# Patient Record
Sex: Male | Born: 1995 | Race: Black or African American | Hispanic: No | Marital: Single | Smoking: Current every day smoker
Health system: Southern US, Community
[De-identification: ages and names within clinical notes are randomized; demographics above are authoritative.]

---

## 2015-03-10 ENCOUNTER — Encounter (HOSPITAL_COMMUNITY): Payer: Self-pay | Admitting: Neurology

## 2015-03-10 ENCOUNTER — Emergency Department (HOSPITAL_COMMUNITY): Payer: BC Managed Care – PPO

## 2015-03-10 ENCOUNTER — Emergency Department (HOSPITAL_COMMUNITY)
Admission: EM | Admit: 2015-03-10 | Discharge: 2015-03-10 | Disposition: A | Discharge status: Home / Self Care | Payer: BC Managed Care – PPO | Attending: Emergency Medicine | Admitting: Emergency Medicine

## 2015-03-10 DIAGNOSIS — Y9389 Activity, other specified: Secondary | ICD-10-CM | POA: Insufficient documentation

## 2015-03-10 DIAGNOSIS — S39012A Strain of muscle, fascia and tendon of lower back, initial encounter: Secondary | ICD-10-CM | POA: Insufficient documentation

## 2015-03-10 DIAGNOSIS — S199XXA Unspecified injury of neck, initial encounter: Secondary | ICD-10-CM | POA: Diagnosis present

## 2015-03-10 DIAGNOSIS — Z720 Tobacco use: Secondary | ICD-10-CM | POA: Insufficient documentation

## 2015-03-10 DIAGNOSIS — S29001A Unspecified injury of muscle and tendon of front wall of thorax, initial encounter: Secondary | ICD-10-CM | POA: Insufficient documentation

## 2015-03-10 DIAGNOSIS — R0789 Other chest pain: Secondary | ICD-10-CM

## 2015-03-10 DIAGNOSIS — Y998 Other external cause status: Secondary | ICD-10-CM | POA: Insufficient documentation

## 2015-03-10 DIAGNOSIS — S161XXA Strain of muscle, fascia and tendon at neck level, initial encounter: Secondary | ICD-10-CM | POA: Diagnosis not present

## 2015-03-10 DIAGNOSIS — Y92410 Unspecified street and highway as the place of occurrence of the external cause: Secondary | ICD-10-CM | POA: Insufficient documentation

## 2015-03-10 MED ORDER — IBUPROFEN 400 MG PO TABS
600.0000 mg | ORAL_TABLET | Freq: Once | ORAL | Status: AC
  Administered 2015-03-10: 600 mg via ORAL
  Filled 2015-03-10: qty 2

## 2015-03-10 MED ORDER — METHOCARBAMOL 500 MG PO TABS: 500.0000 mg | ORAL_TABLET | Freq: Two times a day (BID) | ORAL | Status: AC

## 2015-03-10 MED ORDER — NAPROXEN 500 MG PO TABS: 500.0000 mg | ORAL_TABLET | Freq: Two times a day (BID) | ORAL | Status: DC
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
## 2015-03-10 NOTE — Discharge Instructions (Signed)
Naprosyn for pain. Robaxin for muscle spasms. Try heating pads, stretches, follow up with primary care doctor.    Cervical Sprain A cervical sprain is an injury in the neck in which the strong, fibrous tissues (ligaments) that connect your neck bones stretch or tear. Cervical sprains can range from mild to severe. Severe cervical sprains can cause the neck vertebrae to be unstable. This can lead to damage of the spinal cord and can result in serious nervous system problems. The amount of time it takes for a cervical sprain to get better depends on the cause and extent of the injury. Most cervical sprains heal in 1 to 3 weeks. CAUSES  Severe cervical sprains may be caused by:   Contact sport injuries (such as from football, rugby, wrestling, hockey, auto racing, gymnastics, diving, martial arts, or boxing).   Motor vehicle collisions.   Whiplash injuries. This is an injury from a sudden forward and backward whipping movement of the head and neck.  Falls.  Mild cervical sprains may be caused by:   Being in an awkward position, such as while cradling a telephone between your ear and shoulder.   Sitting in a chair that does not offer proper support.   Working at a poorly Marketing executive station.   Looking up or down for long periods of time.  SYMPTOMS   Pain, soreness, stiffness, or a burning sensation in the front, back, or sides of the neck. This discomfort may develop immediately after the injury or slowly, 24 hours or more after the injury.   Pain or tenderness directly in the middle of the back of the neck.   Shoulder or upper back pain.   Limited ability to move the neck.   Headache.   Dizziness.   Weakness, numbness, or tingling in the hands or arms.   Muscle spasms.   Difficulty swallowing or chewing.   Tenderness and swelling of the neck.  DIAGNOSIS  Most of the time your health care provider can diagnose a cervical sprain by taking your history  and doing a physical exam. Your health care provider will ask about previous neck injuries and any known neck problems, such as arthritis in the neck. X-rays may be taken to find out if there are any other problems, such as with the bones of the neck. Other tests, such as a CT scan or MRI, may also be needed.  TREATMENT  Treatment depends on the severity of the cervical sprain. Mild sprains can be treated with rest, keeping the neck in place (immobilization), and pain medicines. Severe cervical sprains are immediately immobilized. Further treatment is done to help with pain, muscle spasms, and other symptoms and may include:  Medicines, such as pain relievers, numbing medicines, or muscle relaxants.   Physical therapy. This may involve stretching exercises, strengthening exercises, and posture training. Exercises and improved posture can help stabilize the neck, strengthen muscles, and help stop symptoms from returning.  HOME CARE INSTRUCTIONS   Put ice on the injured area.   Put ice in a plastic bag.   Place a towel between your skin and the bag.   Leave the ice on for 15-20 minutes, 3-4 times a day.   If your injury was severe, you may have been given a cervical collar to wear. A cervical collar is a two-piece collar designed to keep your neck from moving while it heals.  Do not remove the collar unless instructed by your health care provider.  If you have long hair,  keep it outside of the collar.  Ask your health care provider before making any adjustments to your collar. Minor adjustments may be required over time to improve comfort and reduce pressure on your chin or on the back of your head.  Ifyou are allowed to remove the collar for cleaning or bathing, follow your health care provider's instructions on how to do so safely.  Keep your collar clean by wiping it with mild soap and water and drying it completely. If the collar you have been given includes removable pads, remove  them every 1-2 days and hand wash them with soap and water. Allow them to air dry. They should be completely dry before you wear them in the collar.  If you are allowed to remove the collar for cleaning and bathing, wash and dry the skin of your neck. Check your skin for irritation or sores. If you see any, tell your health care provider.  Do not drive while wearing the collar.   Only take over-the-counter or prescription medicines for pain, discomfort, or fever as directed by your health care provider.   Keep all follow-up appointments as directed by your health care provider.   Keep all physical therapy appointments as directed by your health care provider.   Make any needed adjustments to your workstation to promote good posture.   Avoid positions and activities that make your symptoms worse.   Warm up and stretch before being active to help prevent problems.  SEEK MEDICAL CARE IF:   Your pain is not controlled with medicine.   You are unable to decrease your pain medicine over time as planned.   Your activity level is not improving as expected.  SEEK IMMEDIATE MEDICAL CARE IF:   You develop any bleeding.  You develop stomach upset.  You have signs of an allergic reaction to your medicine.   Your symptoms get worse.   You develop new, unexplained symptoms.   You have numbness, tingling, weakness, or paralysis in any part of your body.  MAKE SURE YOU:   Understand these instructions.  Will watch your condition.  Will get help right away if you are not doing well or get worse.   This information is not intended to replace advice given to you by your health care provider. Make sure you discuss any questions you have with your health care provider.   Document Released: 03/04/2007 Document Revised: 05/12/2013 Document Reviewed: 11/12/2012 Elsevier Interactive Patient Education 2016 [employer]  Tourist information centre managerMotor Vehicle Collision It is common to have multiple  bruises and sore muscles after a motor vehicle collision (MVC). These tend to feel worse for the first 24 hours. You may have the most stiffness and soreness over the first several hours. You may also feel worse when you wake up the first morning after your collision. After this point, you will usually begin to improve with each day. The speed of improvement often depends on the severity of the collision, the number of injuries, and the location and nature of these injuries. HOME CARE INSTRUCTIONS  Put ice on the injured area.  Put ice in a plastic bag.  Place a towel between your skin and the bag.  Leave the ice on for 15-20 minutes, 3-4 times a day, or as directed by your health care provider.  Drink enough fluids to keep your urine clear or pale yellow. Do not drink alcohol.  Take a warm shower or bath once or twice a day. This will increase blood flow  to sore muscles.  You may return to activities as directed by your caregiver. Be careful when lifting, as this may aggravate neck or back pain.  Only take over-the-counter or prescription medicines for pain, discomfort, or fever as directed by your caregiver. Do not use aspirin. This may increase bruising and bleeding. SEEK IMMEDIATE MEDICAL CARE IF:  You have numbness, tingling, or weakness in the arms or legs.  You develop severe headaches not relieved with medicine.  You have severe neck pain, especially tenderness in the middle of the back of your neck.  You have changes in bowel or bladder control.  There is increasing pain in any area of the body.  You have shortness of breath, light-headedness, dizziness, or fainting.  You have chest pain.  You feel sick to your stomach (nauseous), throw up (vomit), or sweat.  You have increasing abdominal discomfort.  There is blood in your urine, stool, or vomit.  You have pain in your shoulder (shoulder strap areas).  You feel your symptoms are getting worse. MAKE SURE  YOU:  Understand these instructions.  Will watch your condition.  Will get help right away if you are not doing well or get worse.   This information is not intended to replace advice given to you by your health care provider. Make sure you discuss any questions you have with your health care provider.   Document Released: 05/07/2005 Document Revised: 05/28/2014 Document Reviewed: 10/04/2010 Elsevier Interactive Patient Education [employer]

## 2015-03-10 NOTE — ED Notes (Signed)
Pt stable, ambulatory, states understanding of discharge instructions 

## 2015-05-18 ENCOUNTER — Encounter (HOSPITAL_COMMUNITY): Payer: Self-pay | Admitting: Emergency Medicine

## 2015-05-18 DIAGNOSIS — F17200 Nicotine dependence, unspecified, uncomplicated: Secondary | ICD-10-CM | POA: Insufficient documentation

## 2015-05-18 DIAGNOSIS — R1084 Generalized abdominal pain: Secondary | ICD-10-CM | POA: Diagnosis present

## 2015-05-18 DIAGNOSIS — K3580 Unspecified acute appendicitis: Principal | ICD-10-CM | POA: Insufficient documentation

## 2015-05-18 LAB — CBC
HEMATOCRIT: 47.9 % (ref 39.0–52.0)
Hemoglobin: 17 g/dL (ref 13.0–17.0)
MCH: 27.7 pg (ref 26.0–34.0)
MCHC: 35.5 g/dL (ref 30.0–36.0)
MCV: 78 fL (ref 78.0–100.0)
PLATELETS: 242 10*3/uL (ref 150–400)
RBC: 6.14 MIL/uL — ABNORMAL HIGH (ref 4.22–5.81)
RDW: 13.8 % (ref 11.5–15.5)
WBC: 19.8 10*3/uL — ABNORMAL HIGH (ref 4.0–10.5)

## 2015-05-18 LAB — URINE MICROSCOPIC-ADD ON

## 2015-05-18 LAB — URINALYSIS, ROUTINE W REFLEX MICROSCOPIC
Bilirubin Urine: NEGATIVE
GLUCOSE, UA: NEGATIVE mg/dL
HGB URINE DIPSTICK: NEGATIVE
KETONES UR: 15 mg/dL — AB
Nitrite: NEGATIVE
PH: 6 (ref 5.0–8.0)
Protein, ur: 30 mg/dL — AB
Specific Gravity, Urine: 1.036 — ABNORMAL HIGH (ref 1.005–1.030)

## 2015-05-18 LAB — COMPREHENSIVE METABOLIC PANEL
ALBUMIN: 4.9 g/dL (ref 3.5–5.0)
ALT: 39 U/L (ref 17–63)
AST: 28 U/L (ref 15–41)
Alkaline Phosphatase: 55 U/L (ref 38–126)
Anion gap: 16 — ABNORMAL HIGH (ref 5–15)
BUN: 8 mg/dL (ref 6–20)
CHLORIDE: 103 mmol/L (ref 101–111)
CO2: 24 mmol/L (ref 22–32)
CREATININE: 0.99 mg/dL (ref 0.61–1.24)
Calcium: 10.3 mg/dL (ref 8.9–10.3)
GFR calc Af Amer: >60 mL/min (ref >60)
GLUCOSE: 103 mg/dL — AB (ref 65–99)
Potassium: 4.2 mmol/L (ref 3.5–5.1)
SODIUM: 143 mmol/L (ref 135–145)
Total Bilirubin: 1.9 mg/dL — ABNORMAL HIGH (ref 0.3–1.2)
Total Protein: 8.3 g/dL — ABNORMAL HIGH (ref 6.5–8.1)

## 2015-05-18 LAB — LIPASE, BLOOD: LIPASE: 30 U/L (ref 11–51)

## 2015-05-18 MED ORDER — ONDANSETRON 4 MG PO TBDP
4.0000 mg | ORAL_TABLET | Freq: Once | ORAL | Status: AC | PRN
  Administered 2015-05-18: 4 mg via ORAL

## 2015-05-18 MED ORDER — ONDANSETRON 4 MG PO TBDP
ORAL_TABLET | ORAL | Status: AC
  Filled 2015-05-18: qty 1

## 2015-05-18 NOTE — ED Notes (Signed)
Pt from home for eval of emesis, and diarrhea that started at 0300 this morning. Pt states about 15-20 episodes of both and pt unable to keep any fluids or food down. Pt reports sharp pains in his abdomen that started with the vomiting.

## 2015-05-19 ENCOUNTER — Observation Stay (HOSPITAL_COMMUNITY)
Admission: EM | Admit: 2015-05-19 | Discharge: 2015-05-20 | Disposition: A | Discharge status: Home / Self Care | Payer: BC Managed Care – PPO | Attending: General Surgery | Admitting: General Surgery

## 2015-05-19 ENCOUNTER — Encounter (HOSPITAL_COMMUNITY)
Admission: EM | Disposition: A | Discharge status: Home / Self Care | Payer: Self-pay | Source: Home / Self Care | Attending: Emergency Medicine

## 2015-05-19 ENCOUNTER — Emergency Department (HOSPITAL_COMMUNITY): Payer: BC Managed Care – PPO

## 2015-05-19 ENCOUNTER — Encounter (HOSPITAL_COMMUNITY): Payer: Self-pay | Admitting: Radiology

## 2015-05-19 ENCOUNTER — Observation Stay (HOSPITAL_COMMUNITY): Payer: BC Managed Care – PPO | Admitting: Certified Registered Nurse Anesthetist

## 2015-05-19 DIAGNOSIS — K37 Unspecified appendicitis: Secondary | ICD-10-CM | POA: Diagnosis present

## 2015-05-19 DIAGNOSIS — K3580 Unspecified acute appendicitis: Secondary | ICD-10-CM

## 2015-05-19 HISTORY — PX: LAPAROSCOPIC APPENDECTOMY: SHX408

## 2015-05-19 LAB — SURGICAL PCR SCREEN
MRSA, PCR: NEGATIVE
Staphylococcus aureus: NEGATIVE

## 2015-05-19 SURGERY — APPENDECTOMY, LAPAROSCOPIC
Anesthesia: General | Site: Abdomen

## 2015-05-19 MED ORDER — ONDANSETRON 4 MG PO TBDP: 4.0000 mg | ORAL_TABLET | Freq: Four times a day (QID) | ORAL | Status: DC | PRN

## 2015-05-19 MED ORDER — PROPOFOL 10 MG/ML IV BOLUS
INTRAVENOUS | Status: AC
  Filled 2015-05-19: qty 20

## 2015-05-19 MED ORDER — PROPOFOL 10 MG/ML IV BOLUS
INTRAVENOUS | Status: DC | PRN
  Administered 2015-05-19: 200 mg via INTRAVENOUS

## 2015-05-19 MED ORDER — IBUPROFEN 600 MG PO TABS
600.0000 mg | ORAL_TABLET | Freq: Four times a day (QID) | ORAL | Status: DC | PRN
  Filled 2015-05-19: qty 1

## 2015-05-19 MED ORDER — LACTATED RINGERS IV SOLN
INTRAVENOUS | Status: DC
  Administered 2015-05-19: 10:00:00 via INTRAVENOUS

## 2015-05-19 MED ORDER — BUPIVACAINE-EPINEPHRINE 0.25% -1:200000 IJ SOLN
INTRAMUSCULAR | Status: DC | PRN
  Administered 2015-05-19: 12 mL

## 2015-05-19 MED ORDER — PANTOPRAZOLE SODIUM 40 MG IV SOLR
40.0000 mg | Freq: Every day | INTRAVENOUS | Status: DC
  Administered 2015-05-19: 40 mg via INTRAVENOUS
  Filled 2015-05-19: qty 40

## 2015-05-19 MED ORDER — ONDANSETRON HCL 4 MG/2ML IJ SOLN: 4.0000 mg | Freq: Four times a day (QID) | INTRAMUSCULAR | Status: DC | PRN

## 2015-05-19 MED ORDER — MIDAZOLAM HCL 2 MG/2ML IJ SOLN
INTRAMUSCULAR | Status: AC
  Filled 2015-05-19: qty 2

## 2015-05-19 MED ORDER — SUCCINYLCHOLINE CHLORIDE 20 MG/ML IJ SOLN
INTRAMUSCULAR | Status: DC | PRN
  Administered 2015-05-19: 100 mg via INTRAVENOUS

## 2015-05-19 MED ORDER — ONDANSETRON HCL 4 MG/2ML IJ SOLN
4.0000 mg | Freq: Once | INTRAMUSCULAR | Status: AC
  Administered 2015-05-19: 4 mg via INTRAVENOUS
  Filled 2015-05-19: qty 2

## 2015-05-19 MED ORDER — SODIUM CHLORIDE 0.9 % IV BOLUS (SEPSIS)
2000.0000 mL | Freq: Once | INTRAVENOUS | Status: AC
Start: 2015-05-19 — End: 2015-05-19
  Administered 2015-05-19: 2000 mL via INTRAVENOUS

## 2015-05-19 MED ORDER — FENTANYL CITRATE (PF) 100 MCG/2ML IJ SOLN
INTRAMUSCULAR | Status: DC | PRN
Start: 2015-05-19 — End: 2015-05-19
  Administered 2015-05-19: 100 ug via INTRAVENOUS

## 2015-05-19 MED ORDER — SUGAMMADEX SODIUM 200 MG/2ML IV SOLN
INTRAVENOUS | Status: AC
  Filled 2015-05-19: qty 2

## 2015-05-19 MED ORDER — BUPIVACAINE-EPINEPHRINE (PF) 0.25% -1:200000 IJ SOLN
INTRAMUSCULAR | Status: AC
  Filled 2015-05-19: qty 30

## 2015-05-19 MED ORDER — PIPERACILLIN-TAZOBACTAM 3.375 G IVPB
3.3750 g | Freq: Three times a day (TID) | INTRAVENOUS | Status: DC
  Administered 2015-05-19: 3.375 g via INTRAVENOUS
  Filled 2015-05-19 (×3): qty 50

## 2015-05-19 MED ORDER — 0.9 % SODIUM CHLORIDE (POUR BTL) OPTIME
TOPICAL | Status: DC | PRN
  Administered 2015-05-19: 1000 mL

## 2015-05-19 MED ORDER — SUGAMMADEX SODIUM 200 MG/2ML IV SOLN
INTRAVENOUS | Status: DC | PRN
  Administered 2015-05-19: 200 mg via INTRAVENOUS

## 2015-05-19 MED ORDER — HYDROMORPHONE HCL 1 MG/ML IJ SOLN
0.5000 mg | Freq: Once | INTRAMUSCULAR | Status: AC
  Administered 2015-05-19: 0.5 mg via INTRAVENOUS
  Filled 2015-05-19: qty 1

## 2015-05-19 MED ORDER — KCL IN DEXTROSE-NACL 20-5-0.9 MEQ/L-%-% IV SOLN
INTRAVENOUS | Status: DC
  Administered 2015-05-19: 08:00:00 via INTRAVENOUS
  Filled 2015-05-19 (×2): qty 1000

## 2015-05-19 MED ORDER — MORPHINE SULFATE (PF) 2 MG/ML IV SOLN
1.0000 mg | INTRAVENOUS | Status: DC | PRN
  Administered 2015-05-19: 2 mg via INTRAVENOUS
  Filled 2015-05-19: qty 1

## 2015-05-19 MED ORDER — MIDAZOLAM HCL 5 MG/5ML IJ SOLN
INTRAMUSCULAR | Status: DC | PRN
  Administered 2015-05-19: 1 mg via INTRAVENOUS

## 2015-05-19 MED ORDER — OXYCODONE-ACETAMINOPHEN 5-325 MG PO TABS
1.0000 | ORAL_TABLET | ORAL | Status: DC | PRN
  Administered 2015-05-19 – 2015-05-20 (×6): 2 via ORAL
  Filled 2015-05-19 (×6): qty 2

## 2015-05-19 MED ORDER — IOHEXOL 300 MG/ML  SOLN
100.0000 mL | Freq: Once | INTRAMUSCULAR | Status: AC | PRN
  Administered 2015-05-19: 100 mL via INTRAVENOUS

## 2015-05-19 MED ORDER — LIDOCAINE HCL (CARDIAC) 20 MG/ML IV SOLN
INTRAVENOUS | Status: DC | PRN
  Administered 2015-05-19: 80 mg via INTRAVENOUS

## 2015-05-19 MED ORDER — PIPERACILLIN-TAZOBACTAM 3.375 G IVPB
3.3750 g | Freq: Three times a day (TID) | INTRAVENOUS | Status: AC
  Administered 2015-05-19 – 2015-05-20 (×3): 3.375 g via INTRAVENOUS
  Filled 2015-05-19 (×3): qty 50

## 2015-05-19 MED ORDER — SODIUM CHLORIDE 0.9 % IR SOLN
Status: DC | PRN
  Administered 2015-05-19: 1000 mL

## 2015-05-19 MED ORDER — FENTANYL CITRATE (PF) 100 MCG/2ML IJ SOLN: 25.0000 ug | INTRAMUSCULAR | Status: DC | PRN

## 2015-05-19 MED ORDER — ONDANSETRON HCL 4 MG/2ML IJ SOLN: 4.0000 mg | Freq: Once | INTRAMUSCULAR | Status: DC | PRN

## 2015-05-19 MED ORDER — FENTANYL CITRATE (PF) 250 MCG/5ML IJ SOLN
INTRAMUSCULAR | Status: AC
  Filled 2015-05-19: qty 5

## 2015-05-19 MED ORDER — DEXAMETHASONE SODIUM PHOSPHATE 10 MG/ML IJ SOLN
INTRAMUSCULAR | Status: DC | PRN
  Administered 2015-05-19: 10 mg via INTRAVENOUS

## 2015-05-19 MED ORDER — ROCURONIUM BROMIDE 100 MG/10ML IV SOLN
INTRAVENOUS | Status: DC | PRN
  Administered 2015-05-19: 50 mg via INTRAVENOUS

## 2015-05-19 SURGICAL SUPPLY — 41 items
APPLIER CLIP ROT 10 11.4 M/L (STAPLE)
CANISTER SUCTION 2500CC (MISCELLANEOUS) ×3 IMPLANT
CHLORAPREP W/TINT 26ML (MISCELLANEOUS) ×3 IMPLANT
CLIP APPLIE ROT 10 11.4 M/L (STAPLE) IMPLANT
CLOSURE STERI-STRIP 1/2X4 (GAUZE/BANDAGES/DRESSINGS) ×1
CLOSURE WOUND 1/2 X4 (GAUZE/BANDAGES/DRESSINGS) ×1
CLSR STERI-STRIP ANTIMIC 1/2X4 (GAUZE/BANDAGES/DRESSINGS) ×2 IMPLANT
COVER SURGICAL LIGHT HANDLE (MISCELLANEOUS) ×3 IMPLANT
CUTTER FLEX LINEAR 45M (STAPLE) ×3 IMPLANT
DEVICE TROCAR PUNCTURE CLOSURE (ENDOMECHANICALS) ×3 IMPLANT
ELECT REM PT RETURN 9FT ADLT (ELECTROSURGICAL) ×3
ELECTRODE REM PT RTRN 9FT ADLT (ELECTROSURGICAL) ×1 IMPLANT
GLOVE BIO SURGEON STRL SZ7 (GLOVE) ×3 IMPLANT
GLOVE BIOGEL PI IND STRL 7.5 (GLOVE) ×1 IMPLANT
GLOVE BIOGEL PI INDICATOR 7.5 (GLOVE) ×2
GOWN STRL REUS W/ TWL LRG LVL3 (GOWN DISPOSABLE) ×3 IMPLANT
GOWN STRL REUS W/TWL LRG LVL3 (GOWN DISPOSABLE) ×6
KIT BASIN OR (CUSTOM PROCEDURE TRAY) ×3 IMPLANT
KIT ROOM TURNOVER OR (KITS) ×3 IMPLANT
LIQUID BAND (GAUZE/BANDAGES/DRESSINGS) ×3 IMPLANT
NS IRRIG 1000ML POUR BTL (IV SOLUTION) ×3 IMPLANT
PAD ARMBOARD 7.5X6 YLW CONV (MISCELLANEOUS) ×6 IMPLANT
POUCH RETRIEVAL ECOSAC 10 (ENDOMECHANICALS) ×1 IMPLANT
POUCH RETRIEVAL ECOSAC 10MM (ENDOMECHANICALS) ×2
RELOAD 45 VASCULAR/THIN (ENDOMECHANICALS) ×3 IMPLANT
RELOAD STAPLE TA45 3.5 REG BLU (ENDOMECHANICALS) ×3 IMPLANT
SCALPEL HARMONIC ACE (MISCELLANEOUS) ×3 IMPLANT
SCISSORS LAP 5X35 DISP (ENDOMECHANICALS) IMPLANT
SET IRRIG TUBING LAPAROSCOPIC (IRRIGATION / IRRIGATOR) ×3 IMPLANT
SLEEVE ENDOPATH XCEL 5M (ENDOMECHANICALS) ×3 IMPLANT
SPECIMEN JAR SMALL (MISCELLANEOUS) ×3 IMPLANT
STRIP CLOSURE SKIN 1/2X4 (GAUZE/BANDAGES/DRESSINGS) ×2 IMPLANT
SUT MNCRL AB 4-0 PS2 18 (SUTURE) ×3 IMPLANT
SUT VICRYL 0 UR6 27IN ABS (SUTURE) ×3 IMPLANT
TOWEL OR 17X24 6PK STRL BLUE (TOWEL DISPOSABLE) ×3 IMPLANT
TOWEL OR 17X26 10 PK STRL BLUE (TOWEL DISPOSABLE) ×3 IMPLANT
TRAY FOLEY CATH 16FR SILVER (SET/KITS/TRAYS/PACK) ×3 IMPLANT
TRAY LAPAROSCOPIC MC (CUSTOM PROCEDURE TRAY) ×3 IMPLANT
TROCAR XCEL BLUNT TIP 100MML (ENDOMECHANICALS) ×3 IMPLANT
TROCAR XCEL NON-BLD 5MMX100MML (ENDOMECHANICALS) ×3 IMPLANT
TUBING INSUFFLATION (TUBING) ×3 IMPLANT

## 2015-05-19 NOTE — ED Notes (Signed)
Attempted to call report

## 2015-05-19 NOTE — H&P (Signed)
Christopher Graham is an 19 y.o. male.   Chief Complaint: abdominal pain HPI:  The patient is a 19 year old black male who presents to the emergency department with abdominal pain that started about 24 hours ago. The pain is been constant. He complains of pain diffusely but its worse in the right lower quadrant. The pain has been associated with significant nausea and vomiting. He states that he did feel fevered at one point. He came to the emergency department where a CT scan shows inflammation of the appendix but no evidence of perforation. He does have a couple of appendicoliths.  History reviewed. No pertinent past medical history.  History reviewed. No pertinent past surgical history.  No family history on file. Social History:  reports that he has been smoking.  He does not have any smokeless tobacco history on file. He reports that he does not drink alcohol. His drug history is not on file.  Allergies:  Allergies  Allergen Reactions  . Tomato Itching     (Not in a hospital admission)  Results for orders placed or performed during the hospital encounter of 05/19/15 (from the past 48 hour(s))  Lipase, blood     Status: None   Collection Time: 05/18/15  8:36 PM  Result Value Ref Range   Lipase 30 11 - 51 U/L  Comprehensive metabolic panel     Status: Abnormal   Collection Time: 05/18/15  8:36 PM  Result Value Ref Range   Sodium 143 135 - 145 mmol/L   Potassium 4.2 3.5 - 5.1 mmol/L   Chloride 103 101 - 111 mmol/L   CO2 24 22 - 32 mmol/L   Glucose, Bld 103 (H) 65 - 99 mg/dL   BUN 8 6 - 20 mg/dL   Creatinine, Ser 0.99 0.61 - 1.24 mg/dL   Calcium 10.3 8.9 - 10.3 mg/dL   Total Protein 8.3 (H) 6.5 - 8.1 g/dL   Albumin 4.9 3.5 - 5.0 g/dL   AST 28 15 - 41 U/L   ALT 39 17 - 63 U/L   Alkaline Phosphatase 55 38 - 126 U/L   Total Bilirubin 1.9 (H) 0.3 - 1.2 mg/dL   GFR calc non Af Amer >60 >60 mL/min   GFR calc Af Amer >60 >60 mL/min    Comment: (NOTE) The eGFR has been calculated  using the CKD EPI equation. This calculation has not been validated in all clinical situations. eGFR's persistently <60 mL/min signify possible Chronic Kidney Disease.    Anion gap 16 (H) 5 - 15  CBC     Status: Abnormal   Collection Time: 05/18/15  8:36 PM  Result Value Ref Range   WBC 19.8 (H) 4.0 - 10.5 K/uL   RBC 6.14 (H) 4.22 - 5.81 MIL/uL   Hemoglobin 17.0 13.0 - 17.0 g/dL   HCT 47.9 39.0 - 52.0 %   MCV 78.0 78.0 - 100.0 fL   MCH 27.7 26.0 - 34.0 pg   MCHC 35.5 30.0 - 36.0 g/dL   RDW 13.8 11.5 - 15.5 %   Platelets 242 150 - 400 K/uL  Urinalysis, Routine w reflex microscopic (not at [facility])     Status: Abnormal   Collection Time: 05/18/15  9:00 PM  Result Value Ref Range   Color, Urine AMBER (A) YELLOW    Comment: BIOCHEMICALS MAY BE AFFECTED BY COLOR   APPearance CLEAR CLEAR   Specific Gravity, Urine 1.036 (H) 1.005 - 1.030   pH 6.0 5.0 - 8.0   Glucose, UA  NEGATIVE NEGATIVE mg/dL   Hgb urine dipstick NEGATIVE NEGATIVE   Bilirubin Urine NEGATIVE NEGATIVE   Ketones, ur 15 (A) NEGATIVE mg/dL   Protein, ur 30 (A) NEGATIVE mg/dL   Nitrite NEGATIVE NEGATIVE   Leukocytes, UA TRACE (A) NEGATIVE  Urine microscopic-add on     Status: Abnormal   Collection Time: 05/18/15  9:00 PM  Result Value Ref Range   Squamous Epithelial / LPF 0-5 (A) NONE SEEN   WBC, UA 6-30 0 - 5 WBC/hpf   RBC / HPF 0-5 0 - 5 RBC/hpf   Bacteria, UA RARE (A) NONE SEEN   Urine-Other MUCOUS PRESENT    Ct Abdomen Pelvis W Contrast  05/19/2015  CLINICAL DATA:  Acute onset of vomiting and diarrhea. Sharp abdominal pain. Initial encounter. EXAM: CT ABDOMEN AND PELVIS WITH CONTRAST TECHNIQUE: Multidetector CT imaging of the abdomen and pelvis was performed using the standard protocol following bolus administration of intravenous contrast. CONTRAST:  152m OMNIPAQUE IOHEXOL 300 MG/ML  SOLN COMPARISON:  None. FINDINGS: The visualized lung bases are clear. The liver and spleen are unremarkable in appearance. The  gallbladder is within normal limits. The pancreas and adrenal glands are unremarkable. The kidneys are unremarkable in appearance. There is no evidence of hydronephrosis. No renal or ureteral stones are seen. No perinephric stranding is appreciated. No free fluid is identified. The small bowel is unremarkable in appearance. The stomach is within normal limits. No acute vascular abnormalities are seen. The appendix is dilated to 1.6 cm in maximal diameter, with surrounding soft tissue inflammation and fluid. Several appendicoliths are noted within the appendix, including a large 2.1 cm appendicolith at the base of the appendix. There is no definite evidence for perforation or abscess formation. The bladder is is mildly distended and grossly unremarkable. The prostate remains normal in size. No inguinal lymphadenopathy is seen. No acute osseous abnormalities are identified. IMPRESSION: Acute appendicitis, with dilatation of the appendix to 1.6 cm in maximal diameter, surrounding soft tissue inflammation and fluid. Several appendicoliths noted within the appendix, including a large 2.1 cm appendicolith at the base of the appendix. No definite evidence for perforation or abscess formation at this time. These results were called by telephone at the time of interpretation on 05/19/2015 at 5:05 am to Dr. ELonia Skinner who verbally acknowledged these results. Electronically Signed   By: JGarald BaldingM.D.   On: 05/19/2015 05:05    Review of Systems  Constitutional: Positive for fever.  HENT: Negative.   Eyes: Negative.   Respiratory: Negative.   Cardiovascular: Negative.   Gastrointestinal: Positive for nausea, vomiting and abdominal pain.  Genitourinary: Negative.   Musculoskeletal: Negative.   Skin: Negative.   Neurological: Negative.   Endo/Heme/Allergies: Negative.   Psychiatric/Behavioral: Negative.     Blood pressure 122/66, pulse 60, temperature 98.8 F (37.1 C), temperature source Oral, resp.  rate 18, height 5' 7"  (1.702 m), weight 78.472 kg (173 lb), SpO2 100 %. Physical Exam  Constitutional: He is oriented to person, place, and time. He appears well-developed and well-nourished.  HENT:  Head: Normocephalic and atraumatic.  Eyes: Conjunctivae and EOM are normal. Pupils are equal, round, and reactive to light.  Neck: Normal range of motion. Neck supple.  Cardiovascular: Normal rate, regular rhythm and normal heart sounds.   Respiratory: Effort normal and breath sounds normal.  GI:  There is moderate tenderness diffusely, worse in the RLQ  Musculoskeletal: Normal range of motion.  Neurological: He is alert and oriented to person, place, and time.  Skin: Skin is warm and dry.  Psychiatric: He has a normal mood and affect. His behavior is normal.     Assessment/Plan  The patient appears to have acute appendicitis. Because of the risk of perforation and sepsis I think he would benefit from having his appendix removed. I have discussed with him in detail the risks and benefits of the operation to remove the appendix as well as some of the technical aspects and he understands and wishes to proceed. I will plan to admit him to the hospital and start him on broad-spectrum antibiotics. We will plan for surgery in the morning.  TOTH III,Trevaun Rendleman S 05/19/2015, 5:56 AM

## 2015-05-19 NOTE — Op Note (Signed)
Preoperative diagnosis: acute appendicitis Postoperative diagnosis: same as above Procedure: laparoscopic appendectomy Surgeon: Dr Harden MoMatt Brendan Gadson Anesthesia: general EBL: minimal Drains none Specimen appendix to pathology Complications: none Sponge count correct at completion Disposition to recovery stable  Indications: This is a 19 yom with rlq pain and ct with appendicitis. We discussed laparoscopic appendectomy.   Procedure: After informed consent was obtained the patient was taken to the operating room. He was already given antibiotics. Sequential compression devices were on his legs.He was placed under general anesthesia without complication. His abdomen was prepped and draped in the standard sterile surgical fashion. A surgical timeout was then performed. A foley catheter was placed.   I infiltrated marcaine below the umbilicus. I made an incision and then entered the fascia sharply. I then entered the peritoneum bluntly. There was no entry injury. I placed a 0 vicryl pursestring suture and inserted a hasson trocar.I then inserted 2 further 5 mm trocars in the suprapubic region and the left mid abdomen. He did appear to have acute appendicitis.His appendix was suppurative and adherent to the anterior abdominal wall.  I saw the TI into the right colon. I then dissected the appendix free from the cecum. I divided the mesoappendix with the harmonic scalpel. I then divided the appendix with the gia stapler.  I then placed this in a bag and removed it from the abdomen.  I then obtained hemostasis and irrigated. I then removed the umbilical trocar and closed with 0 vicryl and the endoclose device after tying down the pursestring.  I then desufflated the abdomen and removed all my remaining trocars. I then closed these with 4-0 Monocryl and Dermabond. He tolerated this well was extubated and transferred to the recovery room in stable condition

## 2015-05-19 NOTE — ED Notes (Signed)
Attempted to call report, floor not aware

## 2015-05-19 NOTE — Interval H&P Note (Signed)
History and Physical Interval Note:  05/19/2015 9:09 AM  Christopher Graham  has presented today for surgery, with the diagnosis of acute appendicitis  The various methods of treatment have been discussed with the patient and family. After consideration of risks, benefits and other options for treatment, the patient has consented to  Procedure(s): APPENDECTOMY LAPAROSCOPIC (N/A) as a surgical intervention .  The patient's history has been reviewed, patient examined, no change in status, stable for surgery.  I have reviewed the patient's chart and labs.  Questions were answered to the patient's satisfaction.     Madysun Thall

## 2015-05-19 NOTE — ED Notes (Signed)
Attempted to call report, no answer

## 2015-05-19 NOTE — ED Notes (Signed)
MD at bedside. 

## 2015-05-19 NOTE — Anesthesia Procedure Notes (Signed)
Procedure Name: Intubation Date/Time: 05/19/2015 9:47 AM Performed by: Adonis HousekeeperNGELL, Jules Baty M Pre-anesthesia Checklist: Patient identified, Emergency Drugs available, Suction available and Patient being monitored Patient Re-evaluated:Patient Re-evaluated prior to inductionOxygen Delivery Method: Circle system utilized Preoxygenation: Pre-oxygenation with 100% oxygen Intubation Type: IV induction, Rapid sequence and Cricoid Pressure applied Laryngoscope Size: Mac and 4 Grade View: Grade II Tube type: Oral Tube size: 7.5 mm Number of attempts: 1 Airway Equipment and Method: Stylet Placement Confirmation: ETT inserted through vocal cords under direct vision,  positive ETCO2 and breath sounds checked- equal and bilateral Secured at: 22 cm Tube secured with: Tape Dental Injury: Teeth and Oropharynx as per pre-operative assessment

## 2015-05-19 NOTE — Anesthesia Preprocedure Evaluation (Addendum)
Anesthesia Evaluation  Patient identified by MRN, date of birth, ID band Patient awake    Reviewed: Allergy & Precautions, NPO status , Patient's Chart, lab work & pertinent test results  History of Anesthesia Complications Negative for: history of anesthetic complications  Airway Mallampati: III  TM Distance: >3 FB Neck ROM: Full    Dental no notable dental hx. (+) Dental Advisory Given   Pulmonary Current Smoker,    Pulmonary exam normal breath sounds clear to auscultation       Cardiovascular negative cardio ROS Normal cardiovascular exam Rhythm:Regular Rate:Normal     Neuro/Psych negative neurological ROS  negative psych ROS   GI/Hepatic negative GI ROS, Neg liver ROS,   Endo/Other  negative endocrine ROS  Renal/GU negative Renal ROS  negative genitourinary   Musculoskeletal negative musculoskeletal ROS (+)   Abdominal   Peds negative pediatric ROS (+)  Hematology negative hematology ROS (+)   Anesthesia Other Findings   Reproductive/Obstetrics negative OB ROS                            Anesthesia Physical Anesthesia Plan  ASA: II  Anesthesia Plan: General   Post-op Pain Management:    Induction: Intravenous, Rapid sequence and Cricoid pressure planned  Airway Management Planned: Oral ETT  Additional Equipment:   Intra-op Plan:   Post-operative Plan: Extubation in OR  Informed Consent: I have reviewed the patients History and Physical, chart, labs and discussed the procedure including the risks, benefits and alternatives for the proposed anesthesia with the patient or authorized representative who has indicated his/her understanding and acceptance.   Dental advisory given  Plan Discussed with: CRNA  Anesthesia Plan Comments:         Anesthesia Quick Evaluation

## 2015-05-19 NOTE — ED Provider Notes (Signed)
CSN: [id number]     Arrival date & time 05/18/15  1947 History  By signing my name below, I, Jarvis Morgan, attest that this documentation has been prepared under the direction and in the presence of Leta Baptist, MD. Electronically Signed: Jarvis Morgan, ED Scribe. 05/19/2015. 5:37 AM.    Chief Complaint  Patient presents with  . Abdominal Pain  . Emesis    The history is provided by the patient. No language interpreter was used.    HPI Comments: Christopher Graham is a 19 y.o. male who presents to the Emergency Department complaining of intermittent, episodic, moderate, emesis for 1 day. He reports associated diarrhea and sharp, generalized abdominal pain. Pt notes he has had 15-20 episodes of diarrhea and vomiting today. He states he has not been able to tolerate liquids or solids; pt notes he last tried to eat crackers and drink around 2 hours ago and was not able to keep it down. He endorses the abdominal pain is exacerbated with vomiting. Pt has not had any medications prior to arrival. He denies any h/o abdominal surgeries. 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 He denies any medication allergies. He denies any fevers, chills, or other associated symptoms.  History reviewed. No pertinent past medical history. History reviewed. No pertinent past surgical history. No family history on file. Social History  Substance Use Topics  . Smoking status: Current Every Day Smoker  . Smokeless tobacco: None  . Alcohol Use: No    Review of Systems  Constitutional: Negative for fever and chills.  HENT: Negative for congestion, postnasal drip and sore throat.   Eyes: Negative for pain and visual disturbance.  Respiratory: Negative for cough, chest tightness and shortness of breath.   Cardiovascular: Negative for chest pain, palpitations and leg swelling.  Gastrointestinal: Positive for vomiting,  abdominal pain and diarrhea.  Genitourinary: Negative for dysuria, urgency, frequency and hematuria.  Musculoskeletal: Negative for myalgias and back pain.  Skin: Negative for rash and wound.  Neurological: Negative for dizziness, weakness and light-headedness.  Hematological: Does not bruise/bleed easily.      Allergies  Tomato  Home Medications   Prior to Admission medications   Medication Sig Start Date End Date Taking? Authorizing Provider  methocarbamol (ROBAXIN) 500 MG tablet Take 1 tablet (500 mg total) by mouth 2 (two) times daily. Patient not taking: Reported on 05/19/2015 03/10/15   Tatyana Kirichenko, PA-C  naproxen (NAPROSYN) 500 MG tablet Take 1 tablet (500 mg total) by mouth 2 (two) times daily. Patient not taking: Reported on 05/19/2015 03/10/15   Jaynie Crumble, PA-C   Triage Vitals: BP 126/67 mmHg  Pulse 60  Temp(Src) 98 F (36.7 C) (Oral)  Resp 16  Ht  (1.702 m)  Wt 173 lb (78.472 kg)  BMI 27.09 kg/m2  SpO2 98%  Physical Exam  Constitutional: He is oriented to person, place, and time. He appears well-developed and well-nourished. No distress.  HENT:  Head: Normocephalic and atraumatic.  Eyes: Conjunctivae and EOM are normal.  Neck: Neck supple. No tracheal deviation present.  Cardiovascular: Normal rate, regular rhythm, normal heart sounds and intact distal pulses.   No murmur heard. Pulmonary/Chest: Effort normal. No respiratory distress.  Abdominal: Soft. Normal appearance. There is tenderness in the right lower quadrant. There is guarding and tenderness at McBurney's point. There is no rebound and negative Murphy's sign.  Musculoskeletal: Normal  range of motion. He exhibits no edema or tenderness.  Neurological: He is alert and oriented to person, place, and time.  Skin: Skin is warm and dry.  Psychiatric: He has a normal mood and affect. His behavior is normal.  Nursing note and vitals reviewed.   ED Course  Procedures (including  critical care time) Labs Review Labs Reviewed  COMPREHENSIVE METABOLIC PANEL - Abnormal; Notable for the following:    Glucose, Bld 103 (*)    Total Protein 8.3 (*)    Total Bilirubin 1.9 (*)    Anion gap 16 (*)    All other components within normal limits  CBC - Abnormal; Notable for the following:    WBC 19.8 (*)    RBC 6.14 (*)    All other components within normal limits  URINALYSIS, ROUTINE W REFLEX MICROSCOPIC (NOT AT [facility]) - Abnormal; Notable for the following:    Color, Urine AMBER (*)    Specific Gravity, Urine 1.036 (*)    Ketones, ur 15 (*)    Protein, ur 30 (*)    Leukocytes, UA TRACE (*)    All other components within normal limits  URINE MICROSCOPIC-ADD ON - Abnormal; Notable for the following:    Squamous Epithelial / LPF 0-5 (*)    Bacteria, UA RARE (*)    All other components within normal limits  LIPASE, BLOOD    Imaging Review Ct Abdomen Pelvis W Contrast  05/19/2015  CLINICAL DATA:  Acute onset of vomiting and diarrhea. Sharp abdominal pain. Initial encounter. EXAM: CT ABDOMEN AND PELVIS WITH CONTRAST TECHNIQUE: Multidetector CT imaging of the abdomen and pelvis was performed using the standard protocol following bolus administration of intravenous contrast. CONTRAST:  OMNIPAQUE IOHEXOL 300 MG/ML  SOLN COMPARISON:  None. FINDINGS: The visualized lung bases are clear. The liver and spleen are unremarkable in appearance. The gallbladder is within normal limits. The pancreas and adrenal glands are unremarkable. The kidneys are unremarkable in appearance. There is no evidence of hydronephrosis. No renal or ureteral stones are seen. No perinephric stranding is appreciated. No free fluid is identified. The small bowel is unremarkable in appearance. The stomach is within normal limits. No acute vascular abnormalities are seen. The appendix is dilated to 1.6 cm in maximal diameter, with surrounding soft tissue inflammation and fluid. Several appendicoliths are noted  within the appendix, including a large 2.1 cm appendicolith at the base of the appendix. There is no definite evidence for perforation or abscess formation. The bladder is is mildly distended and grossly unremarkable. The prostate remains normal in size. No inguinal lymphadenopathy is seen. No acute osseous abnormalities are identified. IMPRESSION: Acute appendicitis, with dilatation of the appendix to 1.6 cm in maximal diameter, surrounding soft tissue inflammation and fluid. Several appendicoliths noted within the appendix, including a large 2.1 cm appendicolith at the base of the appendix. No definite evidence for perforation or abscess formation at this time. These results were called by telephone at the time of interpretation on 05/19/2015 at 5:05 am to Dr. Tyrone Apple, who verbally acknowledged these results. Electronically Signed   By: Roanna Raider M.D.   On: 05/19/2015 05:05   I have personally reviewed and evaluated these images and lab results as part of my medical decision-making.   EKG Interpretation None      MDM  Patient was seen and evaluated in stable condition. Physical examination concerning for possible appendicitis. Labs revealed a leukocytosis of almost 20,000. Patient was given IV fluids, pain medicine, nausea  medicine. CT is consistent with acute appendicitis. Case was discussed with Dr. Carolynne Edouardoth from general surgery who said he would come to see and evaluate the patient. Patient was updated on all results. Final diagnoses:  None    1. Acute appendicitis I personally performed the services described in this documentation, which was scribed in my presence. The recorded information has been reviewed and is accurate.  Leta BaptistEmily Roe Nguyen, MD 05/19/15 [phone]

## 2015-05-19 NOTE — Progress Notes (Signed)
Parents have pt's belongings.

## 2015-05-19 NOTE — Anesthesia Postprocedure Evaluation (Signed)
Anesthesia Post Note  Patient: Christopher GreenhouseBrent Graham  Procedure(s) Performed: Procedure(s) (LRB): APPENDECTOMY LAPAROSCOPIC (N/A)  Patient location during evaluation: PACU Anesthesia Type: General Level of consciousness: awake and alert Pain management: pain level controlled Vital Signs Assessment: post-procedure vital signs reviewed and stable Respiratory status: spontaneous breathing, nonlabored ventilation, respiratory function stable and patient connected to nasal cannula oxygen Cardiovascular status: blood pressure returned to baseline and stable Postop Assessment: no signs of nausea or vomiting Anesthetic complications: no    Last Vitals:  Filed Vitals:   05/19/15 1044 05/19/15 1045  BP:    Pulse: 95 102  Temp: 36.9 C   Resp: 30 32    Last Pain:  Filed Vitals:   05/19/15 1049  PainSc: 0-No pain                 Aleathea Pugmire JENNETTE

## 2015-05-19 NOTE — Transfer of Care (Signed)
Immediate Anesthesia Transfer of Care Note  Patient: Christopher Graham  Procedure(s) Performed: Procedure(s): APPENDECTOMY LAPAROSCOPIC (N/A)  Patient Location: PACU  Anesthesia Type:General  Level of Consciousness: awake, alert , oriented and patient cooperative  Airway & Oxygen Therapy: Patient Spontanous Breathing and Patient connected to nasal cannula oxygen  Post-op Assessment: Report given to RN, Post -op Vital signs reviewed and stable, Patient moving all extremities and Patient moving all extremities X 4  Post vital signs: Reviewed and stable  Last Vitals:  Filed Vitals:   05/19/15 0711 05/19/15 1044  BP: 129/70   Pulse: 51   Temp: 37.2 C 36.9 C  Resp: 20     Complications: No apparent anesthesia complications

## 2015-05-20 ENCOUNTER — Encounter (HOSPITAL_COMMUNITY): Payer: Self-pay | Admitting: General Surgery

## 2015-05-20 MED ORDER — IBUPROFEN 600 MG PO TABS: 600.0000 mg | ORAL_TABLET | Freq: Four times a day (QID) | ORAL | Status: AC | PRN

## 2015-05-20 MED ORDER — OXYCODONE-ACETAMINOPHEN 5-325 MG PO TABS: 1.0000 | ORAL_TABLET | ORAL | Status: AC | PRN

## 2015-05-20 MED ORDER — POLYETHYLENE GLYCOL 3350 17 G PO PACK: 17.0000 g | PACK | Freq: Every day | ORAL | Status: AC

## 2015-05-20 NOTE — Discharge Instructions (Signed)
Laparoscopic Appendectomy, Adult, Care After °Refer to this sheet in the next few weeks. These instructions provide you with information on caring for yourself after your procedure. Your caregiver may also give you more specific instructions. Your treatment has been planned according to current medical practices, but problems sometimes occur. Call your caregiver if you have any problems or questions after your procedure. °HOME CARE INSTRUCTIONS °· Do not drive while taking narcotic pain medicines. °· Use stool softener if you become constipated from your pain medicines. °· Change your bandages (dressings) as directed. °· Keep your wounds clean and dry. You may wash the wounds gently with soap and water. Gently pat the wounds dry with a clean towel. °· Do not take baths, swim, or use hot tubs for 10 days, or as instructed by your caregiver. °· Only take over-the-counter or prescription medicines for pain, discomfort, or fever as directed by your caregiver. °· You may continue your normal diet as directed. °· Do not lift more than 10 pounds (4.5 kg) or play contact sports for 3 weeks, or as directed. °· Slowly increase your activity after surgery. °· Take deep breaths to avoid getting a lung infection (pneumonia). °SEEK MEDICAL CARE IF: °· You have redness, swelling, or increasing pain in your wounds. °· You have pus coming from your wounds. °· You have drainage from a wound that lasts longer than 1 day. °· You notice a bad smell coming from the wounds or dressing. °· Your wound edges break open after stitches (sutures) have been removed. °· You notice increasing pain in the shoulders (shoulder strap areas) or near your shoulder blades. °· You develop dizzy episodes or fainting while standing. °· You develop shortness of breath. °· You develop persistent nausea or vomiting. °· You cannot control your bowel functions or lose your appetite. °· You develop diarrhea. °SEEK IMMEDIATE MEDICAL CARE IF:  °· You have a  fever. °· You develop a rash. °· You have difficulty breathing or sharp pains in your chest. °· You develop any reaction or side effects to medicines given. °MAKE SURE YOU: °· Understand these instructions. °· Will watch your condition. °· Will get help right away if you are not doing well or get worse. °  °This information is not intended to replace advice given to you by your health care provider. Make sure you discuss any questions you have with your health care provider. °  °Document Released: 05/07/2005 Document Revised: 09/21/2014 Document Reviewed: 10/25/2014 °Elsevier Interactive Patient Education ©2016 [employer] ° °CCS ______[facility] °LAPAROSCOPIC SURGERY: POST OP INSTRUCTIONS °Always review your discharge instruction sheet given to you by the facility where your surgery was performed. °IF YOU HAVE DISABILITY OR FAMILY LEAVE FORMS, YOU MUST BRING THEM TO THE OFFICE FOR PROCESSING.   °DO NOT GIVE THEM TO YOUR DOCTOR. ° °1. A prescription for pain medication may be given to you upon discharge.  Take your pain medication as prescribed, if needed.  If narcotic pain medicine is not needed, then you may take acetaminophen (Tylenol) or ibuprofen (Advil) as needed. °2. Take your usually prescribed medications unless otherwise directed. °3. If you need a refill on your pain medication, please contact your pharmacy.  They will contact our office to request authorization. Prescriptions will not be filled after 5pm or on week-ends. °4. You should follow a light diet the first few days after arrival home, such as soup and crackers, etc.  Be sure to include lots of fluids daily. °5. Most   patients will experience some swelling and bruising in the area of the incisions.  Ice packs will help.  Swelling and bruising can take several days to resolve.  °6. It is common to experience some constipation if taking pain medication after surgery.  Increasing fluid intake and taking a stool softener (such as  Colace) will usually help or prevent this problem from occurring.  A mild laxative (Milk of Magnesia or Miralax) should be taken according to package instructions if there are no bowel movements after 48 hours. °7. Unless discharge instructions indicate otherwise, you may remove your bandages 24-48 hours after surgery, and you may shower at that time.  You may have steri-strips (small skin tapes) in place directly over the incision.  These strips should be left on the skin for 7-10 days.  If your surgeon used skin glue on the incision, you may shower in 24 hours.  The glue will flake off over the next 2-3 weeks.  Any sutures or staples will be removed at the office during your follow-up visit. °8. ACTIVITIES:  You may resume regular (light) daily activities beginning the next day--such as daily self-care, walking, climbing stairs--gradually increasing activities as tolerated.  You may have sexual intercourse when it is comfortable.  Refrain from any heavy lifting or straining until approved by your doctor. °a. You may drive when you are no longer taking prescription pain medication, you can comfortably wear a seatbelt, and you can safely maneuver your car and apply brakes. °b. RETURN TO WORK:  __________________________________________________________ °9. You should see your doctor in the office for a follow-up appointment approximately 2-3 weeks after your surgery.  Make sure that you call for this appointment within a day or two after you arrive home to insure a convenient appointment time. °10. OTHER INSTRUCTIONS: __________________________________________________________________________________________________________________________ __________________________________________________________________________________________________________________________ °WHEN TO CALL YOUR DOCTOR: °1. Fever over 101.0 °2. Inability to urinate °3. Continued bleeding from incision. °4. Increased pain, redness, or drainage from the  incision. °5. Increasing abdominal pain ° °The clinic staff is available to answer your questions during regular business hours.  Please don’t hesitate to call and ask to speak to one of the nurses for clinical concerns.  If you have a medical emergency, go to the nearest emergency room or call 911.  A surgeon from [facility] is always on call at the hospital. °[address], [address], [place], [place]  [address] ? [address] [address], [place], [place]   [address] °[phone] ? [phone] ? FAX [phone] °Web site: [email or web address] ° °

## 2015-05-20 NOTE — Progress Notes (Signed)
Pt discharging with family at this time taking all personal belongings. IV discontinued, dry dressing applied. Discharge instructions along with prescriptions and work note provided with verbal understanding. Surgical dressing sites clean dry and intact. No noted distress.

## 2015-05-20 NOTE — Discharge Summary (Signed)
Physician Discharge Summary  Patient ID: Christopher Graham MRN: [id number] DOB/AGE: Oct 02, 1995 19 y.o.  Admit date: 05/19/2015 Discharge date: 05/20/2015  Admitting Diagnosis: Acute appendicitis  Discharge Diagnosis Patient Active Problem List   Diagnosis Date Noted  . Appendicitis 05/19/2015    Consultants none  Imaging: Ct Abdomen Pelvis W Contrast  05/19/2015  CLINICAL DATA:  Acute onset of vomiting and diarrhea. Sharp abdominal pain. Initial encounter. EXAM: CT ABDOMEN AND PELVIS WITH CONTRAST TECHNIQUE: Multidetector CT imaging of the abdomen and pelvis was performed using the standard protocol following bolus administration of intravenous contrast. CONTRAST:  100mL OMNIPAQUE IOHEXOL 300 MG/ML  SOLN COMPARISON:  None. FINDINGS: The visualized lung bases are clear. The liver and spleen are unremarkable in appearance. The gallbladder is within normal limits. The pancreas and adrenal glands are unremarkable. The kidneys are unremarkable in appearance. There is no evidence of hydronephrosis. No renal or ureteral stones are seen. No perinephric stranding is appreciated. No free fluid is identified. The small bowel is unremarkable in appearance. The stomach is within normal limits. No acute vascular abnormalities are seen. The appendix is dilated to 1.6 cm in maximal diameter, with surrounding soft tissue inflammation and fluid. Several appendicoliths are noted within the appendix, including a large 2.1 cm appendicolith at the base of the appendix. There is no definite evidence for perforation or abscess formation. The bladder is is mildly distended and grossly unremarkable. The prostate remains normal in size. No inguinal lymphadenopathy is seen. No acute osseous abnormalities are identified. IMPRESSION: Acute appendicitis, with dilatation of the appendix to 1.6 cm in maximal diameter, surrounding soft tissue inflammation and fluid. Several appendicoliths noted within the appendix, including a  large 2.1 cm appendicolith at the base of the appendix. No definite evidence for perforation or abscess formation at this time. These results were called by telephone at the time of interpretation on 05/19/2015 at 5:05 am to Dr. Tyrone AppleEMILY NGUYEN, who verbally acknowledged these results. Electronically Signed   By: Roanna RaiderJeffery  Chang M.D.   On: 05/19/2015 05:05    Procedures Laparoscopic appendectomy---Dr. [place]a [place]   Hospital Course:  Christopher GreenhouseBrent Borras is a healthy 19 year old male who [facility].  Workup showed acute appendicitis.  Patient was admitted and underwent procedure listed above.  Tolerated procedure well and was transferred to the floor.  Diet was advanced as tolerated.  On POD#1, the patient was voiding well, tolerating diet, ambulating well, pain well controlled, vital signs stable, incisions c/d/i and felt stable for discharge home.  Medication risks, benefits and therapeutic alternatives were reviewed with the patient.  He verbalizes understanding.  Patient will follow up in our office in 2 weeks and knows to call with questions or concerns.  Physical Exam: General:  Alert, NAD, pleasant, comfortable Abd:  Soft, ND, mild tenderness, incisions C/D/I    Medication List    STOP taking these medications        naproxen 500 MG tablet  Commonly known as:  NAPROSYN      TAKE these medications        ibuprofen 600 MG tablet  Commonly known as:  ADVIL,MOTRIN  Take 1 tablet (600 mg total) by mouth every 6 (six) hours as needed for fever or headache.     methocarbamol 500 MG tablet  Commonly known as:  ROBAXIN  Take 1 tablet (500 mg total) by mouth 2 (two) times daily.     oxyCODONE-acetaminophen 5-325 MG tablet  Commonly known as:  PERCOCET/ROXICET  Take  1-2 tablets by mouth every 4 (four) hours as needed for moderate pain or severe pain.     polyethylene glycol packet  Commonly known as:  MIRALAX  Take 17 g by mouth daily.             Follow-up  Information    Follow up with [facility] On 06/01/2015.   Specialty:  General Surgery   Why:  Your appointment is at 9 AM, be there 30 minutes early for check in   Contact information:   [address] [place] [address] [phone]       Signed: Ashok Norris, [facility] [phone]  05/20/2015, 9:20 AM

## 2015-05-20 NOTE — Progress Notes (Signed)
Pt ambulated in the hallway with this nurse. Gait steady. No noted distress. Pt returned to room complaint of pain to surgical steri strip sites clean dry and intact. Pain medication administered for comfort. Waiting on family. Call bell within reach. Will continue to monitor.
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
